# Patient Record
Sex: Female | Born: 1940 | Race: White | Hispanic: No | Smoking: Former smoker
Health system: Southern US, Community
[De-identification: ages and names within clinical notes are randomized; demographics above are authoritative.]

## PROBLEM LIST (undated history)

## (undated) DIAGNOSIS — I10 Essential (primary) hypertension: Secondary | ICD-10-CM

## (undated) DIAGNOSIS — C50919 Malignant neoplasm of unspecified site of unspecified female breast: Secondary | ICD-10-CM

## (undated) DIAGNOSIS — K219 Gastro-esophageal reflux disease without esophagitis: Secondary | ICD-10-CM

## (undated) DIAGNOSIS — Z9221 Personal history of antineoplastic chemotherapy: Secondary | ICD-10-CM

## (undated) HISTORY — PX: BREAST BIOPSY: SHX20

## (undated) HISTORY — DX: Malignant neoplasm of unspecified site of unspecified female breast: C50.919

## (undated) HISTORY — PX: ADENOIDECTOMY: SUR15

## (undated) HISTORY — DX: Gastro-esophageal reflux disease without esophagitis: K21.9

## (undated) HISTORY — PX: TONSILLECTOMY: SUR1361

## (undated) HISTORY — DX: Essential (primary) hypertension: I10

---

## 1985-10-13 HISTORY — PX: ABDOMINAL HYSTERECTOMY: SUR658

## 1988-10-13 DIAGNOSIS — I10 Essential (primary) hypertension: Secondary | ICD-10-CM

## 1988-10-13 HISTORY — DX: Essential (primary) hypertension: I10

## 1991-10-14 DIAGNOSIS — C50919 Malignant neoplasm of unspecified site of unspecified female breast: Secondary | ICD-10-CM

## 1991-10-14 HISTORY — DX: Malignant neoplasm of unspecified site of unspecified female breast: C50.919

## 1991-10-14 HISTORY — PX: MASTECTOMY: SHX3

## 2000-01-23 ENCOUNTER — Encounter: Payer: Self-pay | Admitting: Internal Medicine

## 2000-01-23 ENCOUNTER — Encounter: Admission: RE | Admit: 2000-01-23 | Discharge: 2000-01-23 | Payer: Self-pay | Admitting: Internal Medicine

## 2000-02-03 ENCOUNTER — Encounter: Payer: Self-pay | Admitting: Internal Medicine

## 2000-02-03 ENCOUNTER — Encounter: Admission: RE | Admit: 2000-02-03 | Discharge: 2000-02-03 | Payer: Self-pay | Admitting: Internal Medicine

## 2000-11-09 ENCOUNTER — Other Ambulatory Visit: Admission: RE | Admit: 2000-11-09 | Discharge: 2000-11-09 | Payer: Self-pay | Admitting: Internal Medicine

## 2001-05-13 ENCOUNTER — Encounter: Admission: RE | Admit: 2001-05-13 | Discharge: 2001-05-13 | Payer: Self-pay | Admitting: Internal Medicine

## 2001-05-13 ENCOUNTER — Encounter: Payer: Self-pay | Admitting: Internal Medicine

## 2002-06-14 ENCOUNTER — Encounter: Admission: RE | Admit: 2002-06-14 | Discharge: 2002-06-14 | Payer: Self-pay | Admitting: Internal Medicine

## 2002-06-14 ENCOUNTER — Encounter: Payer: Self-pay | Admitting: Internal Medicine

## 2002-09-01 ENCOUNTER — Encounter: Payer: Self-pay | Admitting: Internal Medicine

## 2002-09-01 ENCOUNTER — Encounter: Admission: RE | Admit: 2002-09-01 | Discharge: 2002-09-01 | Payer: Self-pay | Admitting: Internal Medicine

## 2003-11-17 ENCOUNTER — Encounter: Admission: RE | Admit: 2003-11-17 | Discharge: 2003-11-17 | Payer: Self-pay | Admitting: Internal Medicine

## 2004-12-02 ENCOUNTER — Encounter: Admission: RE | Admit: 2004-12-02 | Discharge: 2004-12-02 | Payer: Self-pay | Admitting: Internal Medicine

## 2005-05-16 ENCOUNTER — Encounter: Admission: RE | Admit: 2005-05-16 | Discharge: 2005-05-16 | Payer: Self-pay | Admitting: Internal Medicine

## 2005-10-13 HISTORY — PX: BREAST BIOPSY: SHX20

## 2006-01-16 ENCOUNTER — Encounter: Admission: RE | Admit: 2006-01-16 | Discharge: 2006-01-16 | Payer: Self-pay | Admitting: Internal Medicine

## 2007-01-28 ENCOUNTER — Encounter: Admission: RE | Admit: 2007-01-28 | Discharge: 2007-01-28 | Payer: Self-pay | Admitting: Internal Medicine

## 2008-05-17 ENCOUNTER — Encounter: Admission: RE | Admit: 2008-05-17 | Discharge: 2008-05-17 | Payer: Self-pay | Admitting: Internal Medicine

## 2009-06-08 ENCOUNTER — Encounter: Admission: RE | Admit: 2009-06-08 | Discharge: 2009-06-08 | Payer: Self-pay | Admitting: Internal Medicine

## 2010-08-22 ENCOUNTER — Encounter: Admission: RE | Admit: 2010-08-22 | Discharge: 2010-08-22 | Payer: Self-pay | Admitting: Internal Medicine

## 2010-10-13 HISTORY — PX: TYMPANOSTOMY TUBE PLACEMENT: SHX32

## 2010-11-03 ENCOUNTER — Encounter: Payer: Self-pay | Admitting: Internal Medicine

## 2011-08-21 ENCOUNTER — Other Ambulatory Visit: Payer: Self-pay | Admitting: Internal Medicine

## 2011-08-21 DIAGNOSIS — Z1231 Encounter for screening mammogram for malignant neoplasm of breast: Secondary | ICD-10-CM

## 2011-08-21 DIAGNOSIS — Z9012 Acquired absence of left breast and nipple: Secondary | ICD-10-CM

## 2011-09-22 ENCOUNTER — Ambulatory Visit: Payer: Self-pay

## 2011-09-25 ENCOUNTER — Ambulatory Visit: Payer: Self-pay

## 2011-10-08 ENCOUNTER — Ambulatory Visit: Payer: Self-pay

## 2011-10-22 ENCOUNTER — Ambulatory Visit
Admission: RE | Admit: 2011-10-22 | Discharge: 2011-10-22 | Disposition: A | Discharge status: Home / Self Care | Payer: Self-pay | Source: Ambulatory Visit | Attending: Internal Medicine | Admitting: Internal Medicine

## 2011-10-22 DIAGNOSIS — Z9012 Acquired absence of left breast and nipple: Secondary | ICD-10-CM

## 2011-10-22 DIAGNOSIS — Z1231 Encounter for screening mammogram for malignant neoplasm of breast: Secondary | ICD-10-CM

## 2012-10-26 ENCOUNTER — Other Ambulatory Visit: Payer: Self-pay | Admitting: Internal Medicine

## 2012-10-26 DIAGNOSIS — Z1231 Encounter for screening mammogram for malignant neoplasm of breast: Secondary | ICD-10-CM

## 2012-11-26 ENCOUNTER — Ambulatory Visit: Payer: BC Managed Care – PPO

## 2012-12-03 ENCOUNTER — Ambulatory Visit
Admission: RE | Admit: 2012-12-03 | Discharge: 2012-12-03 | Disposition: A | Discharge status: Home / Self Care | Payer: BC Managed Care – PPO | Source: Ambulatory Visit | Attending: Internal Medicine | Admitting: Internal Medicine

## 2014-01-18 ENCOUNTER — Other Ambulatory Visit: Payer: Self-pay

## 2014-01-18 DIAGNOSIS — Z9012 Acquired absence of left breast and nipple: Secondary | ICD-10-CM

## 2014-01-18 DIAGNOSIS — Z1231 Encounter for screening mammogram for malignant neoplasm of breast: Secondary | ICD-10-CM

## 2014-01-18 DIAGNOSIS — Z853 Personal history of malignant neoplasm of breast: Secondary | ICD-10-CM

## 2014-01-31 ENCOUNTER — Ambulatory Visit
Admission: RE | Admit: 2014-01-31 | Discharge: 2014-01-31 | Disposition: A | Discharge status: Home / Self Care | Payer: Medicare Other | Source: Ambulatory Visit

## 2014-01-31 ENCOUNTER — Encounter (INDEPENDENT_AMBULATORY_CARE_PROVIDER_SITE_OTHER): Payer: Self-pay

## 2014-01-31 DIAGNOSIS — Z9012 Acquired absence of left breast and nipple: Secondary | ICD-10-CM

## 2014-01-31 DIAGNOSIS — Z853 Personal history of malignant neoplasm of breast: Secondary | ICD-10-CM

## 2014-01-31 DIAGNOSIS — Z1231 Encounter for screening mammogram for malignant neoplasm of breast: Secondary | ICD-10-CM

## 2015-09-19 ENCOUNTER — Other Ambulatory Visit: Payer: Self-pay

## 2015-09-19 DIAGNOSIS — Z1231 Encounter for screening mammogram for malignant neoplasm of breast: Secondary | ICD-10-CM

## 2015-10-09 ENCOUNTER — Ambulatory Visit
Admission: RE | Admit: 2015-10-09 | Discharge: 2015-10-09 | Disposition: A | Discharge status: Home / Self Care | Payer: Medicare Other | Source: Ambulatory Visit

## 2015-10-09 DIAGNOSIS — Z1231 Encounter for screening mammogram for malignant neoplasm of breast: Secondary | ICD-10-CM

## 2016-03-05 ENCOUNTER — Other Ambulatory Visit: Payer: Self-pay

## 2016-03-05 MED ORDER — OMEPRAZOLE 40 MG PO CPDR: DELAYED_RELEASE_CAPSULE | ORAL | Status: DC

## 2016-03-05 MED ORDER — OMEPRAZOLE 40 MG PO CPDR: DELAYED_RELEASE_CAPSULE | ORAL | Status: AC

## 2016-03-12 ENCOUNTER — Ambulatory Visit: Payer: Self-pay | Admitting: Allergy and Immunology

## 2016-03-17 ENCOUNTER — Ambulatory Visit (INDEPENDENT_AMBULATORY_CARE_PROVIDER_SITE_OTHER): Payer: Medicare Other | Admitting: Allergy and Immunology

## 2016-03-17 ENCOUNTER — Encounter: Payer: Self-pay | Admitting: Allergy and Immunology

## 2016-03-17 VITALS — BP 124/74 | HR 88 | Resp 22 | Ht 61.22 in | Wt 165.3 lb

## 2016-03-17 DIAGNOSIS — K219 Gastro-esophageal reflux disease without esophagitis: Secondary | ICD-10-CM

## 2016-03-17 DIAGNOSIS — J387 Other diseases of larynx: Secondary | ICD-10-CM

## 2016-03-17 DIAGNOSIS — Z8709 Personal history of other diseases of the respiratory system: Secondary | ICD-10-CM

## 2016-03-17 NOTE — Progress Notes (Signed)
Follow-up Note  Referring Provider: No ref. provider found Primary Provider: Gilford Rile, MD Date of Office Visit: 03/17/2016  Subjective:   Bonnie Fitzpatrick (DOB: 1941-06-14) is a 75 y.o. female who returns to [facility]2016 in re-evaluation of the following:  HPI: Bonnie Fitzpatrick presents to this clinic in reevaluation of her reflux-induced respiratory disease and history of intermittent asthma and allergic rhinitis and chronic sinusitis. It has been over 1 year since I've seen her in his clinic.  About 3 weeks ago she contracted a episode of sinusitis with vertigo and was treated with Kenalog and Levaquin but otherwise has had very little respiratory tract symptoms throughout the year. Her asthma has been inactive for years and she has not used a short-acting bronchodilator in years. She can smell food to some degree and has not had any episodes of sinusitis other than just described. She continues to use omeprazole 40 mg twice a day and has had very little issues with reflux or her throat. She did receive the zoster immunization and received the flu vaccine last year. She has not received Prevnar.    Medication List           aspirin 81 MG tablet  Take 81 mg by mouth daily.     calcium carbonate 1500 (600 Ca) MG Tabs tablet  Commonly known as:  OSCAL  Take 600 mg by mouth daily with breakfast.     cholecalciferol 1000 units tablet  Commonly known as:  VITAMIN D  Take 1,000 Units by mouth daily.     FISH OIL PO  Take 1 capsule by mouth daily.     hydrochlorothiazide 25 MG tablet  Commonly known as:  HYDRODIURIL  Take 25 mg by mouth daily.     losartan 50 MG tablet  Commonly known as:  COZAAR  Take 50 mg by mouth daily.     omeprazole 40 MG capsule  Commonly known as:  PRILOSEC  Take one capsule twice daily for reflux.     simvastatin 40 MG tablet  Commonly known as:  ZOCOR  Take 40 mg by mouth daily.     spironolactone 50 MG tablet  Commonly  known as:  ALDACTONE  Take 50 mg by mouth daily.     vitamin B-12 100 MCG tablet  Commonly known as:  CYANOCOBALAMIN  Take 100 mcg by mouth daily.        Past Medical History  Diagnosis Date  . High blood pressure 1990  . GERD (gastroesophageal reflux disease)   . Breast CA ([place]) 1993    Past Surgical History  Procedure Laterality Date  . Adenoidectomy    . Tonsillectomy    . Tympanostomy tube placement  2012    no longer in place  . Abdominal hysterectomy  1987  . Mastectomy Left 1993    Allergies  Allergen Reactions  . Sulfa Antibiotics Anaphylaxis  . Codeine Nausea Only    Review of systems negative except as noted in HPI / PMHx or noted below:  Review of Systems  Constitutional: Negative.   HENT: Negative.   Eyes: Negative.   Respiratory: Negative.   Cardiovascular: Negative.   Gastrointestinal: Negative.   Genitourinary: Negative.   Musculoskeletal: Negative.   Skin: Negative.   Neurological: Negative.   Endo/Heme/Allergies: Negative.   Psychiatric/Behavioral: Negative.      Objective:   Filed Vitals:   03/17/16 1649  BP: 124/74  Pulse: 88  Resp: 22  Height: 5' 1.22" (155.5 cm)  Weight: 165 lb 5.5 oz (75 kg)   Physical Exam  Constitutional: She is well-developed, well-nourished, and in no distress.  HENT:  Head: Normocephalic.  Right Ear: Tympanic membrane, external ear and ear canal normal.  Left Ear: Tympanic membrane, external ear and ear canal normal.  Nose: Nose normal. No mucosal edema or rhinorrhea.  Mouth/Throat: Uvula is midline, oropharynx is clear and moist and mucous membranes are normal. No oropharyngeal exudate.  Eyes: Conjunctivae are normal.  Neck: Trachea normal. No tracheal tenderness present. No tracheal deviation present. No thyromegaly present.  Cardiovascular: Normal rate, regular rhythm, S1 normal, S2 normal and normal heart sounds.   No murmur heard. Pulmonary/Chest: Breath sounds normal. No stridor. No  respiratory distress. She has no wheezes. She has no rales.  Musculoskeletal: She exhibits no edema.  Lymphadenopathy:       Head (right side): No tonsillar adenopathy present.       Head (left side): No tonsillar adenopathy present.    She has no cervical adenopathy.  Neurological: She is alert. Gait normal.  Skin: No rash noted. She is not diaphoretic. No erythema. Nails show no clubbing.  Psychiatric: Mood and affect normal.    Diagnostics: None   Assessment and Plan:   1. LPRD (laryngopharyngeal reflux disease)   2. History of asthma   3. History of chronic sinusitis   4. History of allergic rhinitis     1. Have Dr. Bea Graff fill prescription for omeprazole 40 mg twice a day  2. Return to clinic if needed  I will see [name] back in this clinic should she develop significant problems as she moves forward but otherwise I see no need for her to follow up in this clinic on a regular basis. I'll certainly be happy to see her back in this clinic if she develop significant problems with her respiratory tract. She can have Dr. Bea Graff fill her prescription for omeprazole.  Allena Katz, MD [place]

## 2016-03-17 NOTE — Patient Instructions (Addendum)
  1. Have Dr. Bea Graff fill prescription for omeprazole 40 mg twice a day  2. Return to clinic if needed

## 2016-11-25 ENCOUNTER — Other Ambulatory Visit: Payer: Self-pay | Admitting: Internal Medicine

## 2016-11-25 DIAGNOSIS — Z1231 Encounter for screening mammogram for malignant neoplasm of breast: Secondary | ICD-10-CM

## 2016-12-17 ENCOUNTER — Ambulatory Visit
Admission: RE | Admit: 2016-12-17 | Discharge: 2016-12-17 | Disposition: A | Discharge status: Home / Self Care | Payer: Medicare Other | Source: Ambulatory Visit | Attending: Internal Medicine | Admitting: Internal Medicine

## 2016-12-17 DIAGNOSIS — Z1231 Encounter for screening mammogram for malignant neoplasm of breast: Secondary | ICD-10-CM

## 2017-11-11 ENCOUNTER — Other Ambulatory Visit: Payer: Self-pay | Admitting: Internal Medicine

## 2017-11-11 DIAGNOSIS — Z1231 Encounter for screening mammogram for malignant neoplasm of breast: Secondary | ICD-10-CM

## 2017-12-22 ENCOUNTER — Ambulatory Visit
Admission: RE | Admit: 2017-12-22 | Discharge: 2017-12-22 | Disposition: A | Discharge status: Home / Self Care | Payer: Medicare Other | Source: Ambulatory Visit | Attending: Internal Medicine | Admitting: Internal Medicine

## 2017-12-22 DIAGNOSIS — Z1231 Encounter for screening mammogram for malignant neoplasm of breast: Secondary | ICD-10-CM

## 2017-12-22 HISTORY — DX: Malignant neoplasm of unspecified site of unspecified female breast: C50.919

## 2018-07-20 ENCOUNTER — Encounter: Payer: Self-pay | Admitting: Gastroenterology

## 2018-12-07 ENCOUNTER — Other Ambulatory Visit: Payer: Self-pay | Admitting: Internal Medicine

## 2018-12-07 DIAGNOSIS — Z1231 Encounter for screening mammogram for malignant neoplasm of breast: Secondary | ICD-10-CM

## 2019-01-11 ENCOUNTER — Ambulatory Visit: Payer: Medicare Other

## 2019-03-03 ENCOUNTER — Ambulatory Visit: Payer: Medicare Other

## 2019-04-19 ENCOUNTER — Ambulatory Visit
Admission: RE | Admit: 2019-04-19 | Discharge: 2019-04-19 | Disposition: A | Discharge status: Home / Self Care | Payer: Medicare Other | Source: Ambulatory Visit | Attending: Internal Medicine | Admitting: Internal Medicine

## 2019-04-19 DIAGNOSIS — Z1231 Encounter for screening mammogram for malignant neoplasm of breast: Secondary | ICD-10-CM

## 2019-04-19 HISTORY — DX: Personal history of antineoplastic chemotherapy: Z92.21

## 2019-04-20 ENCOUNTER — Other Ambulatory Visit: Payer: Self-pay | Admitting: Internal Medicine

## 2019-04-20 DIAGNOSIS — R928 Other abnormal and inconclusive findings on diagnostic imaging of breast: Secondary | ICD-10-CM

## 2019-04-21 ENCOUNTER — Other Ambulatory Visit: Payer: Self-pay | Admitting: Specialist

## 2019-04-21 DIAGNOSIS — R928 Other abnormal and inconclusive findings on diagnostic imaging of breast: Secondary | ICD-10-CM

## 2019-04-22 ENCOUNTER — Ambulatory Visit
Admission: RE | Admit: 2019-04-22 | Discharge: 2019-04-22 | Disposition: A | Discharge status: Home / Self Care | Payer: Medicare Other | Source: Ambulatory Visit | Attending: Internal Medicine | Admitting: Internal Medicine

## 2019-04-22 ENCOUNTER — Other Ambulatory Visit: Payer: Self-pay

## 2019-04-22 ENCOUNTER — Other Ambulatory Visit: Payer: Self-pay | Admitting: Internal Medicine

## 2019-04-22 DIAGNOSIS — R928 Other abnormal and inconclusive findings on diagnostic imaging of breast: Secondary | ICD-10-CM

## 2020-05-04 ENCOUNTER — Other Ambulatory Visit: Payer: Self-pay | Admitting: Internal Medicine

## 2020-05-04 DIAGNOSIS — Z1231 Encounter for screening mammogram for malignant neoplasm of breast: Secondary | ICD-10-CM

## 2020-05-08 ENCOUNTER — Other Ambulatory Visit: Payer: Self-pay

## 2020-05-08 ENCOUNTER — Ambulatory Visit
Admission: RE | Admit: 2020-05-08 | Discharge: 2020-05-08 | Disposition: A | Discharge status: Home / Self Care | Payer: Medicare Other | Source: Ambulatory Visit | Attending: Internal Medicine | Admitting: Internal Medicine

## 2020-05-08 DIAGNOSIS — Z1231 Encounter for screening mammogram for malignant neoplasm of breast: Secondary | ICD-10-CM

## 2021-04-23 ENCOUNTER — Other Ambulatory Visit: Payer: Self-pay | Admitting: Internal Medicine

## 2021-04-23 DIAGNOSIS — Z1231 Encounter for screening mammogram for malignant neoplasm of breast: Secondary | ICD-10-CM

## 2021-06-19 ENCOUNTER — Ambulatory Visit
Admission: RE | Admit: 2021-06-19 | Discharge: 2021-06-19 | Disposition: A | Discharge status: Home / Self Care | Payer: Medicare PPO | Source: Ambulatory Visit | Attending: Internal Medicine | Admitting: Internal Medicine

## 2021-06-19 ENCOUNTER — Other Ambulatory Visit: Payer: Self-pay

## 2021-06-19 DIAGNOSIS — Z1231 Encounter for screening mammogram for malignant neoplasm of breast: Secondary | ICD-10-CM

## 2022-01-15 IMAGING — MG MM DIGITAL SCREENING UNILAT*R* W/ TOMO W/ CAD
4 series · 4 of 12 positions shown · non-contrast
Comparison: Previous exam(s).

CLINICAL DATA: Screening.

EXAM:
DIGITAL SCREENING UNILATERAL RIGHT MAMMOGRAM WITH CAD AND
TOMOSYNTHESIS
TECHNIQUE: Right screening digital craniocaudal and mediolateral oblique
mammograms were obtained. Right screening digital breast
tomosynthesis was performed. The images were evaluated with
computer-aided detection.

[R CC synth-2D]
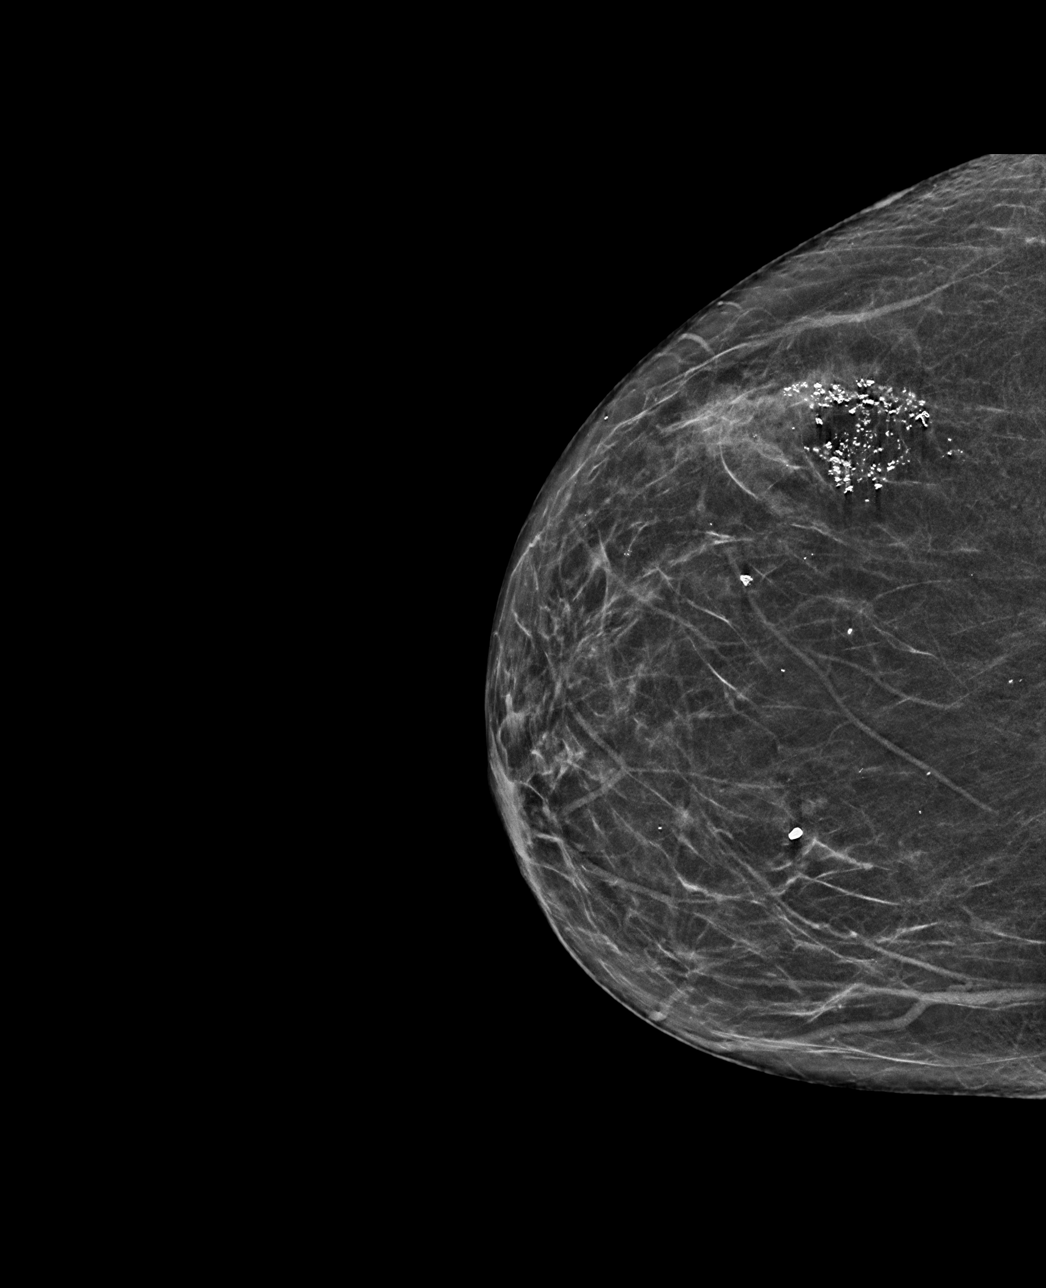

[R MLO synth-2D]
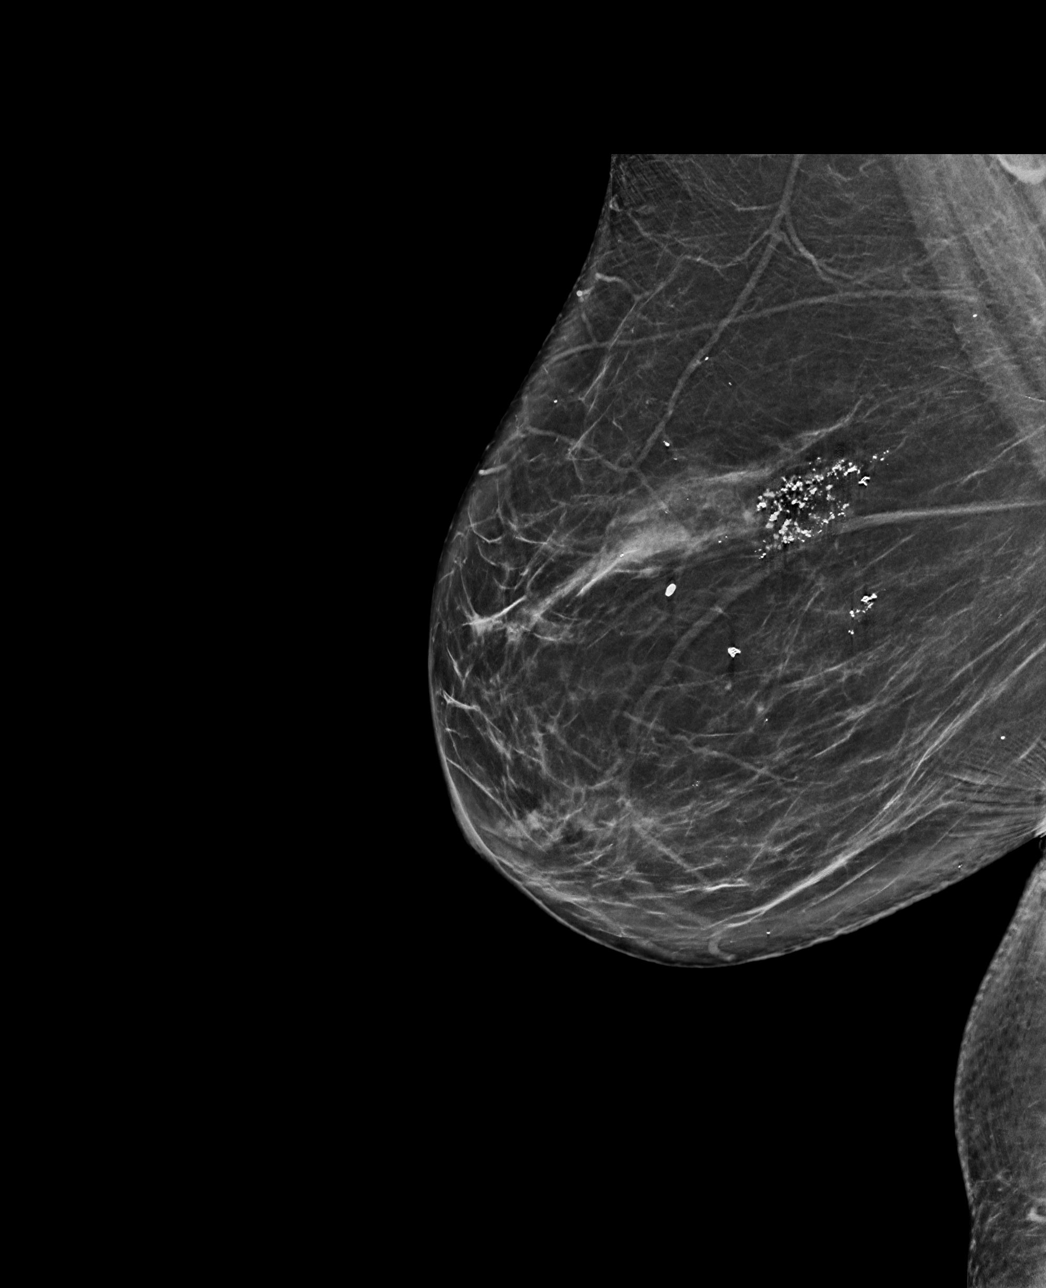

[R CC tomo · tomo slice 31/60.0]
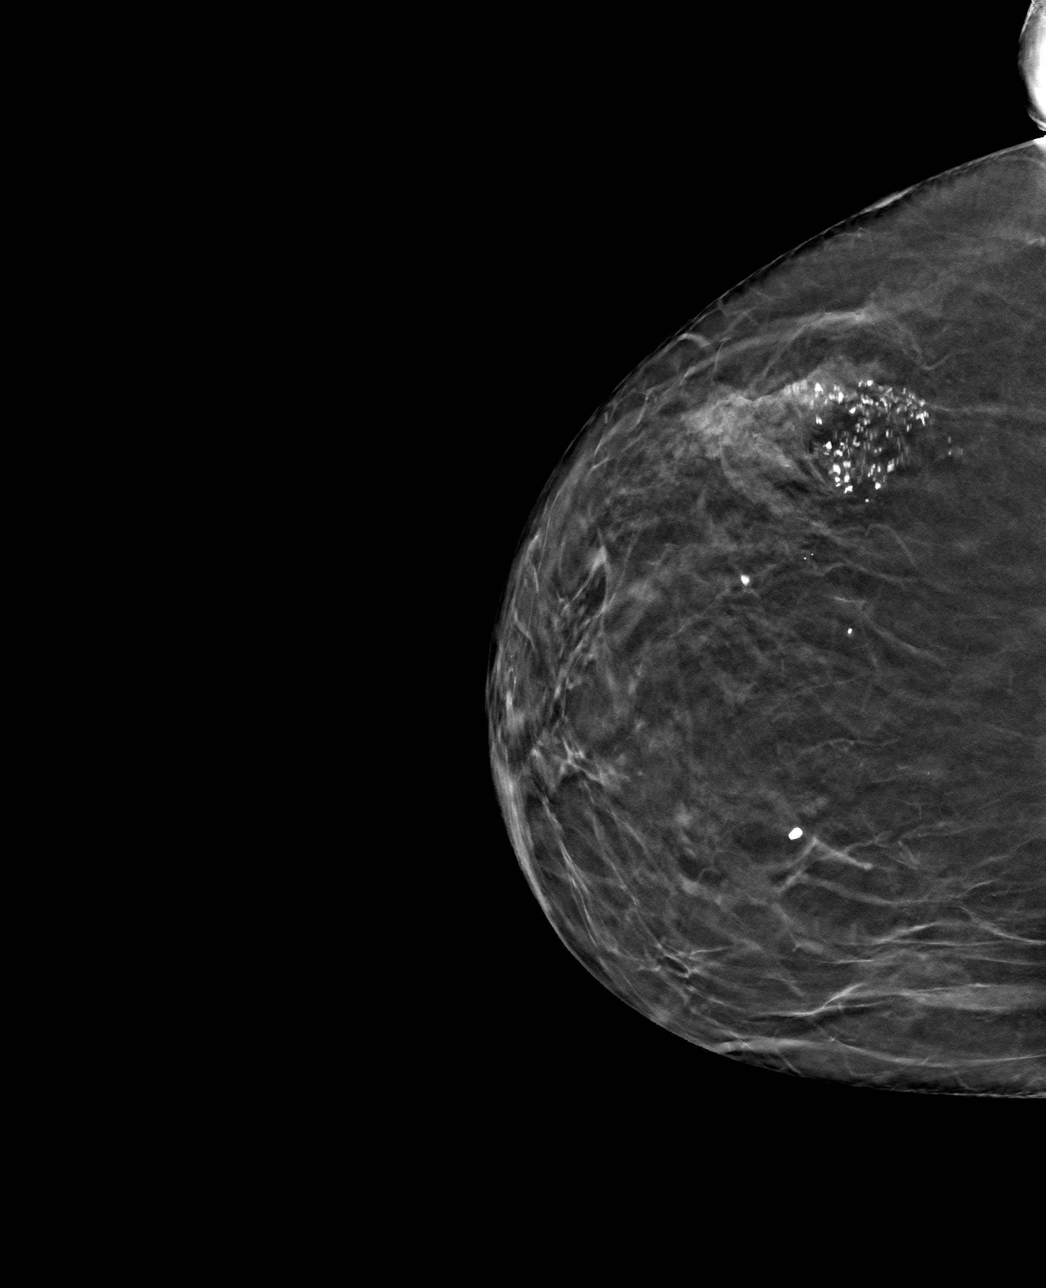

[R MLO tomo · tomo slice 40/79.0]
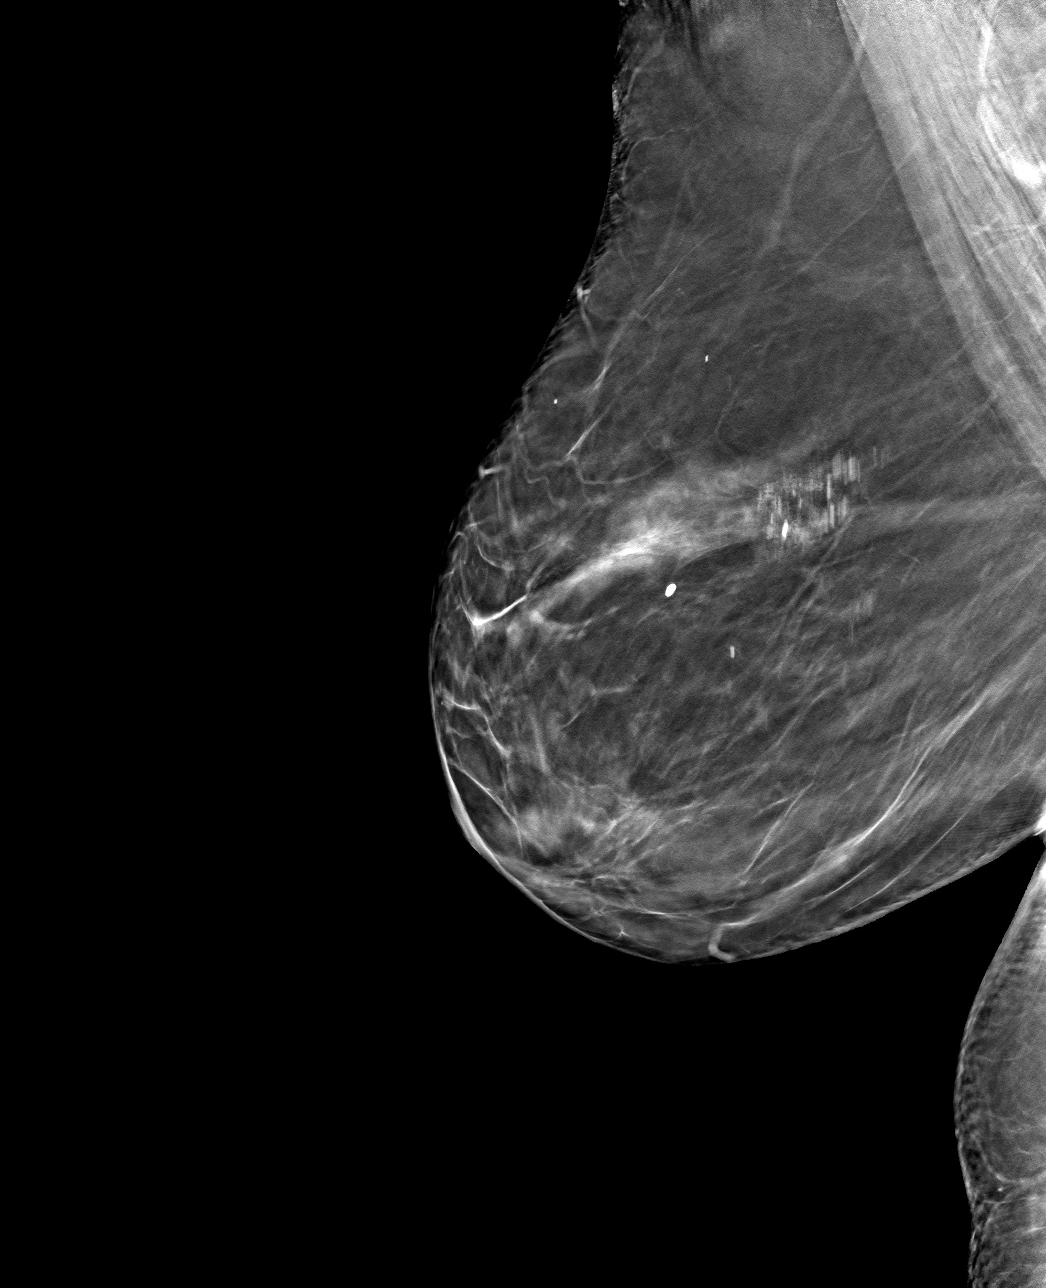

[4 of 12 positions shown; findings below may reference images not displayed]

ACR Breast Density Category b: There are scattered areas of
fibroglandular density.
FINDINGS: The patient has had a left mastectomy. There are no findings
suspicious for malignancy.
IMPRESSION: No mammographic evidence of malignancy. A result letter of this
screening mammogram will be mailed directly to the patient.

RECOMMENDATION:
Screening mammogram in one year.  (Code:NT-E-EGT)

BI-RADS CATEGORY  1: Negative.

## 2022-05-22 ENCOUNTER — Other Ambulatory Visit: Payer: Self-pay | Admitting: Internal Medicine

## 2022-05-22 DIAGNOSIS — Z1231 Encounter for screening mammogram for malignant neoplasm of breast: Secondary | ICD-10-CM

## 2022-06-25 ENCOUNTER — Other Ambulatory Visit: Payer: Self-pay | Admitting: Internal Medicine

## 2022-06-25 ENCOUNTER — Ambulatory Visit
Admission: RE | Admit: 2022-06-25 | Discharge: 2022-06-25 | Disposition: A | Discharge status: Home / Self Care | Payer: Medicare PPO | Source: Ambulatory Visit | Attending: Internal Medicine | Admitting: Internal Medicine

## 2022-06-25 DIAGNOSIS — Z1231 Encounter for screening mammogram for malignant neoplasm of breast: Secondary | ICD-10-CM

## 2023-05-13 ENCOUNTER — Other Ambulatory Visit: Payer: Self-pay | Admitting: Internal Medicine

## 2023-05-13 DIAGNOSIS — Z1231 Encounter for screening mammogram for malignant neoplasm of breast: Secondary | ICD-10-CM

## 2023-06-29 ENCOUNTER — Ambulatory Visit: Payer: Medicare PPO
